# Patient Record
Sex: Female | Born: 1935 | Race: White | Hispanic: No | Marital: Single | Smoking: Never smoker
Health system: Southern US, Community
[De-identification: ages and names within clinical notes are randomized; demographics above are authoritative.]

## PROBLEM LIST (undated history)

## (undated) DIAGNOSIS — M1990 Unspecified osteoarthritis, unspecified site: Secondary | ICD-10-CM

## (undated) DIAGNOSIS — I509 Heart failure, unspecified: Secondary | ICD-10-CM

## (undated) DIAGNOSIS — M810 Age-related osteoporosis without current pathological fracture: Secondary | ICD-10-CM

## (undated) DIAGNOSIS — H409 Unspecified glaucoma: Secondary | ICD-10-CM

## (undated) DIAGNOSIS — E785 Hyperlipidemia, unspecified: Secondary | ICD-10-CM

## (undated) DIAGNOSIS — J449 Chronic obstructive pulmonary disease, unspecified: Secondary | ICD-10-CM

## (undated) DIAGNOSIS — K219 Gastro-esophageal reflux disease without esophagitis: Secondary | ICD-10-CM

## (undated) DIAGNOSIS — I10 Essential (primary) hypertension: Secondary | ICD-10-CM

## (undated) HISTORY — DX: Chronic obstructive pulmonary disease, unspecified: J44.9

---

## 2014-06-16 ENCOUNTER — Other Ambulatory Visit (HOSPITAL_COMMUNITY): Payer: Self-pay | Admitting: Internal Medicine

## 2014-06-16 DIAGNOSIS — R1310 Dysphagia, unspecified: Secondary | ICD-10-CM

## 2014-06-20 ENCOUNTER — Ambulatory Visit (HOSPITAL_COMMUNITY)
Admission: RE | Admit: 2014-06-20 | Discharge: 2014-06-20 | Disposition: A | Discharge status: Home / Self Care | Payer: Medicare Other | Source: Ambulatory Visit | Attending: Internal Medicine | Admitting: Internal Medicine

## 2014-06-20 DIAGNOSIS — R1310 Dysphagia, unspecified: Secondary | ICD-10-CM

## 2014-06-20 DIAGNOSIS — R1313 Dysphagia, pharyngeal phase: Secondary | ICD-10-CM | POA: Insufficient documentation

## 2014-06-20 NOTE — Procedures (Signed)
Objective Swallowing Evaluation: Modified Barium Swallowing Study  Patient Details  Name: Penny Alvarez MRN: [id number] Date of Birth: 1918/10/05  Today's Date: 06/20/2014 Time: 4098-1191 SLP Time Calculation (min): 21 min  Past Medical History: No past medical history on file. Past Surgical History: No past surgical history on file. HPI:  80 yo female with PMH significant for acid reflux, arthritis, HTN, CHF, glaucoma, allergic rhinitis, Mortons neuroma, right bundle branch blocl, hyperlipidemia, and osteoporosis. She is referred from SLP at her ALF to rule out silent aspiration.     Assessment / Plan / Recommendation Clinical Impression  Dysphagia Diagnosis: Mild pharyngeal phase dysphagia Clinical impression: Pt presents with a mild pharyngeal dysphagia characterized by mild pharyngeal residue at the vallecular and posterior pharyngeal wall. Mixed consistency bolus containing a pill and thin liquids via straw resulted in trace-mild amounts of premature spillage of thin liquid to the pyriform sinuses, with barium tablet remaining in the valleculae until easily cleared with a pureed bolus. Pt demonstrated adequate airway protection across all consistencies with no penetration or aspiration observed. Recommend to continue regular textures and thin liquids with medications administered whole in puree.    Treatment Recommendation       Diet Recommendation Regular;Thin liquid   Liquid Administration via: Cup;Straw Medication Administration: Whole meds with puree Supervision: Patient able to self feed Compensations: Slow rate;Small sips/bites Postural Changes and/or Swallow Maneuvers: Seated upright 90 degrees;Upright 30-60 min after meal    Other  Recommendations Oral Care Recommendations: Oral care BID   Follow Up Recommendations  None    Frequency and Duration        Pertinent Vitals/Pain n/a    SLP Swallow Goals     General Date of Onset:  (pt reports this has been ongoing for  "years") HPI: 80 yo female with PMH significant for acid reflux, arthritis, HTN, CHF, glaucoma, allergic rhinitis, Mortons neuroma, right bundle branch blocl, hyperlipidemia, and osteoporosis. She is referred from SLP at her ALF to rule out silent aspiration. Type of Study: Modified Barium Swallowing Study Reason for Referral: Objectively evaluate swallowing function Previous Swallow Assessment: none in chart Diet Prior to this Study: Regular;Thin liquids Respiratory Status: Room air History of Recent Intubation: No Behavior/Cognition: Alert;Cooperative;Pleasant mood Oral Cavity - Dentition: Adequate natural dentition Oral Motor / Sensory Function: Within functional limits Self-Feeding Abilities: Able to feed self Patient Positioning: Upright in chair Baseline Vocal Quality: Clear Volitional Swallow: Able to elicit Anatomy: Within functional limits Pharyngeal Secretions: Not observed secondary MBS    Reason for Referral Objectively evaluate swallowing function   Oral Phase Oral Preparation/Oral Phase Oral Phase: WFL   Pharyngeal Phase Pharyngeal Phase Pharyngeal Phase: Impaired Pharyngeal - Thin Pharyngeal - Thin Cup: Reduced pharyngeal peristalsis;Reduced tongue base retraction;Pharyngeal residue - valleculae;Pharyngeal residue - posterior pharnyx Pharyngeal - Thin Straw: Reduced pharyngeal peristalsis;Reduced tongue base retraction;Pharyngeal residue - valleculae;Pharyngeal residue - posterior pharnyx;Premature spillage to pyriform sinuses (trace-mild premature spillage only when taking pill) Pharyngeal - Solids Pharyngeal - Puree: Reduced pharyngeal peristalsis;Reduced tongue base retraction Pharyngeal - Regular: Reduced pharyngeal peristalsis;Reduced tongue base retraction;Pharyngeal residue - valleculae;Pharyngeal residue - posterior pharnyx  Cervical Esophageal Phase    GO    Cervical Esophageal Phase Cervical Esophageal Phase: [facility]    Functional Assessment Tool Used:  skilled clinical judgment Functional Limitations: Swallowing Swallow Current Status ([id number]): At least 1 percent but less than 20 percent impaired, limited or restricted Swallow Goal Status [phone]): At least 1 percent but less than 20 percent impaired, limited or restricted Swallow Discharge  Status [phone]([id number]): At least 1 percent but less than 20 percent impaired, limited or restricted      Penny Alvarez, M.A. CCC-SLP [phone]  Penny Alvarez, Penny Alvarez 06/20/2014, 12:41 PM

## 2014-09-15 ENCOUNTER — Encounter (HOSPITAL_BASED_OUTPATIENT_CLINIC_OR_DEPARTMENT_OTHER): Payer: Self-pay | Admitting: Emergency Medicine

## 2014-09-15 ENCOUNTER — Emergency Department (HOSPITAL_BASED_OUTPATIENT_CLINIC_OR_DEPARTMENT_OTHER): Payer: Medicare Other

## 2014-09-15 ENCOUNTER — Emergency Department (HOSPITAL_BASED_OUTPATIENT_CLINIC_OR_DEPARTMENT_OTHER)
Admission: EM | Admit: 2014-09-15 | Discharge: 2014-09-15 | Disposition: A | Discharge status: Home / Self Care | Payer: Medicare Other | Attending: Emergency Medicine | Admitting: Emergency Medicine

## 2014-09-15 DIAGNOSIS — Y9389 Activity, other specified: Secondary | ICD-10-CM | POA: Insufficient documentation

## 2014-09-15 DIAGNOSIS — M549 Dorsalgia, unspecified: Secondary | ICD-10-CM

## 2014-09-15 DIAGNOSIS — W1839XA Other fall on same level, initial encounter: Secondary | ICD-10-CM | POA: Diagnosis not present

## 2014-09-15 DIAGNOSIS — S239XXD Sprain of unspecified parts of thorax, subsequent encounter: Secondary | ICD-10-CM | POA: Insufficient documentation

## 2014-09-15 DIAGNOSIS — S43401A Unspecified sprain of right shoulder joint, initial encounter: Secondary | ICD-10-CM

## 2014-09-15 DIAGNOSIS — S32010D Wedge compression fracture of first lumbar vertebra, subsequent encounter for fracture with routine healing: Secondary | ICD-10-CM | POA: Diagnosis not present

## 2014-09-15 DIAGNOSIS — I509 Heart failure, unspecified: Secondary | ICD-10-CM | POA: Diagnosis not present

## 2014-09-15 DIAGNOSIS — S39012A Strain of muscle, fascia and tendon of lower back, initial encounter: Secondary | ICD-10-CM | POA: Diagnosis not present

## 2014-09-15 DIAGNOSIS — K219 Gastro-esophageal reflux disease without esophagitis: Secondary | ICD-10-CM | POA: Insufficient documentation

## 2014-09-15 DIAGNOSIS — Z8739 Personal history of other diseases of the musculoskeletal system and connective tissue: Secondary | ICD-10-CM | POA: Diagnosis not present

## 2014-09-15 DIAGNOSIS — Y929 Unspecified place or not applicable: Secondary | ICD-10-CM | POA: Insufficient documentation

## 2014-09-15 DIAGNOSIS — Z8719 Personal history of other diseases of the digestive system: Secondary | ICD-10-CM | POA: Diagnosis not present

## 2014-09-15 DIAGNOSIS — S29019D Strain of muscle and tendon of unspecified wall of thorax, subsequent encounter: Secondary | ICD-10-CM | POA: Insufficient documentation

## 2014-09-15 DIAGNOSIS — I10 Essential (primary) hypertension: Secondary | ICD-10-CM | POA: Diagnosis not present

## 2014-09-15 DIAGNOSIS — M4856XD Collapsed vertebra, not elsewhere classified, lumbar region, subsequent encounter for fracture with routine healing: Secondary | ICD-10-CM

## 2014-09-15 DIAGNOSIS — W19XXXA Unspecified fall, initial encounter: Secondary | ICD-10-CM

## 2014-09-15 DIAGNOSIS — S29012D Strain of muscle and tendon of back wall of thorax, subsequent encounter: Secondary | ICD-10-CM

## 2014-09-15 DIAGNOSIS — Z8639 Personal history of other endocrine, nutritional and metabolic disease: Secondary | ICD-10-CM | POA: Insufficient documentation

## 2014-09-15 DIAGNOSIS — S233XXD Sprain of ligaments of thoracic spine, subsequent encounter: Secondary | ICD-10-CM

## 2014-09-15 DIAGNOSIS — S22080D Wedge compression fracture of T11-T12 vertebra, subsequent encounter for fracture with routine healing: Secondary | ICD-10-CM | POA: Diagnosis not present

## 2014-09-15 DIAGNOSIS — H409 Unspecified glaucoma: Secondary | ICD-10-CM | POA: Insufficient documentation

## 2014-09-15 DIAGNOSIS — Z79899 Other long term (current) drug therapy: Secondary | ICD-10-CM | POA: Diagnosis not present

## 2014-09-15 DIAGNOSIS — M4854XD Collapsed vertebra, not elsewhere classified, thoracic region, subsequent encounter for fracture with routine healing: Secondary | ICD-10-CM

## 2014-09-15 DIAGNOSIS — S3992XA Unspecified injury of lower back, initial encounter: Secondary | ICD-10-CM | POA: Diagnosis present

## 2014-09-15 HISTORY — DX: Hyperlipidemia, unspecified: E78.5

## 2014-09-15 HISTORY — DX: Age-related osteoporosis without current pathological fracture: M81.0

## 2014-09-15 HISTORY — DX: Essential (primary) hypertension: I10

## 2014-09-15 HISTORY — DX: Gastro-esophageal reflux disease without esophagitis: K21.9

## 2014-09-15 HISTORY — DX: Unspecified osteoarthritis, unspecified site: M19.90

## 2014-09-15 HISTORY — DX: Heart failure, unspecified: I50.9

## 2014-09-15 HISTORY — DX: Unspecified glaucoma: H40.9

## 2014-09-15 MED ORDER — ACETAMINOPHEN 325 MG PO TABS
650.0000 mg | ORAL_TABLET | Freq: Once | ORAL | Status: AC
  Administered 2014-09-15: 650 mg via ORAL
  Filled 2014-09-15: qty 2

## 2014-09-15 NOTE — ED Notes (Signed)
Pt with [facility] from [facility] with right arm pain since last week. Pt fell last weekend but was not evaluated. Pt has not received her BP medication this morning.

## 2014-09-15 NOTE — ED Notes (Signed)
MD at bedside. 

## 2014-09-15 NOTE — ED Provider Notes (Signed)
CSN: [id number]     Arrival date & time 09/15/14  [id number] History   First MD Initiated Contact with Patient 09/15/14 1052     Chief Complaint  Patient presents with  . Arm Pain     Patient is a 80 y.o. female presenting with arm pain. The history is provided by the patient.  Arm Pain This is a new problem. The problem occurs constantly. The problem has not changed since onset.Pertinent negatives include no chest pain, no abdominal pain and no headaches. Exacerbated by: movement. The symptoms are relieved by rest.  Pt fell last week while at nursing facility (she uses walker, ran into a trash can and fell down) injuring her right arm as well as her low back.  No head injury.  No LOC No HA No CP/abd pain No weakness She does report h/o back pain prior to fall and now worse since fall No new weakness She has not been evaluated for her right arm pain  She uses walker at baseline  Past Medical History  Diagnosis Date  . Hypertension   . Congestive heart failure   . Glaucoma   . Arthritis   . Osteoporosis   . GERD (gastroesophageal reflux disease)   . Hyperlipidemia    No past surgical history on file. No family history on file. History  Substance Use Topics  . Smoking status: Never Smoker   . Smokeless tobacco: Not on file  . Alcohol Use: No   OB History   Grav Para Term Preterm Abortions TAB SAB Ect Mult Living                 Review of Systems  Constitutional: Negative for fatigue.  Cardiovascular: Negative for chest pain.  Gastrointestinal: Negative for abdominal pain.  Neurological: Negative for headaches.  All other systems reviewed and are negative.     Allergies  Actonel; Aspirin; Depakote; Diltiazem; Fosamax; and Hydralazine  Home Medications   Prior to Admission medications   Medication Sig Start Date End Date Taking? Authorizing Provider  nebivolol (BYSTOLIC) 2.5 MG tablet Take 2.5 mg by mouth daily.   Yes Historical Provider, MD  pantoprazole  (PROTONIX) 40 MG tablet Take 40 mg by mouth daily.   Yes Historical Provider, MD  torsemide (DEMADEX) 20 MG tablet Take 20 mg by mouth daily.   Yes Historical Provider, MD   BP 219/81  Pulse 75  Temp(Src) 99.1 F (37.3 C) (Oral)  Resp 16  SpO2 100% Physical Exam CONSTITUTIONAL: elderly, frail but no distress HEAD: Normocephalic/atraumatic EYES: EOMI/PERRL ENMT: Mucous membranes moist NECK: supple no meningeal signs SPINE:no cervical spine tenderness Lower thoracic/upper lumbar tenderness No bruising/crepitance/stepoffs noted to spine CV: S1/S2 noted, no murmurs/rubs/gallops noted LUNGS: Lungs are clear to auscultation bilaterally, no apparent distress ABDOMEN: soft, nontender, no rebound or guarding GU:no cva tenderness NEURO: Pt is awake/alert, moves all extremitiesx4 EXTREMITIES: pulses normal, full ROM Mild tenderness to right anterior shoulder.  No deformity is noted.  She can abduct right shoulder but is limited due to pain.   All other extremities/joints palpated/ranged and nontender No tenderness with ROM of either hip SKIN: warm, color normal PSYCH: no abnormalities of mood noted  ED Course  Procedures   Pt can ambulate here in the ED with walker No ACUTE finding on imaging She is well appearing, no distress I feel she is safe/appropriate for d/c home (no acute spinal injury) Discussed with patient and her relative/power of attorney Imaging Review Dg Thoracic Spine 2 View  09/15/2014   CLINICAL DATA:  Mid and low back pain post fall last week and, personal history osteoporosis, initial encounter  EXAM: THORACIC SPINE - 2 VIEW  COMPARISON:  None ; correlation chest radiographs 08/14/2012  FINDINGS: Diffuse osseous demineralization.  Marked superior endplate compression deformity of the T12 vertebral body with retropulsion of fragments.  Increased height loss of T12 versus prior chest radiograph.  Remaining thoracic vertebrae normal in height and alignment.  No  additional fracture or subluxation.  Levoconvex thoracolumbar scoliosis.  Extensive atherosclerotic calcification aorta.  Mitral annular calcification noted.  IMPRESSION: Marked osseous demineralization.  Increased superior endplate compression fracture of T12 vertebral body since 2013.  Persistent mild retropulsion of T12 fragments.   Electronically Signed   By: Ulyses SouthwardMark  Boles M.D.   On: 09/15/2014 12:28   Dg Lumbar Spine Complete  09/15/2014   CLINICAL DATA:  Fall, low back pain  EXAM: LUMBAR SPINE - COMPLETE 4+ VIEW  COMPARISON:  07/11/2011  FINDINGS: Five lumbar type vertebral bodies.  Normal lumbar lordosis.  Moderate to severe anterior compression fracture deformity at L1. No retropulsion. This has progressed from 2012, when it was mild.  Lumbar vertebral body heights are essentially maintained, with mild superior endplate changes at L2 and L3.  Grade 1 anterolisthesis of L4 on L5.  Mild multilevel degenerative changes.  Visualized bony pelvis appears intact.  Vascular calcifications.  Right total hip arthroplasty.  IMPRESSION: Moderate to severe anterior compression fracture deformity at L1, progressed from 2012. No retropulsion.   Electronically Signed   By: Charline BillsSriyesh  Krishnan M.D.   On: 09/15/2014 12:17   Dg Shoulder Right  09/15/2014   CLINICAL DATA:  Fall last weekend with right arm pain since. Prior shoulder surgery.  EXAM: RIGHT SHOULDER - 2+ VIEW  COMPARISON:  07/31/2009  FINDINGS: Plate and screw fixation of a proximal humerus fracture is again identified. Fracture alignment on AP and scapular Y-views is grossly unchanged compared to the prior study allowing for some differences in projection and rotation. No dislocation is seen. No definite acute fracture is identified. No definite evidence of screw loosening or fracture. There is progressive, severe glenohumeral joint space narrowing with subchondral sclerosis.  IMPRESSION: Old proximal right humerus fracture status post internal fixation  without definite acute osseous abnormality identified. Progressive, severe glenohumeral osteoarthrosis.   Electronically Signed   By: Sebastian AcheAllen  Grady   On: 09/15/2014 11:09      MDM   Final diagnoses:  Fall  Back pain  Sprain of right shoulder, initial encounter  Lumbar strain, initial encounter  Thoracic sprain and strain, subsequent encounter  Compression fracture of thoracic spine, non-traumatic, with routine healing, subsequent encounter  Compression fracture of lumbar spine, non-traumatic, with routine healing, subsequent encounter    Nursing notes including past medical history and social history reviewed and considered in documentation xrays reviewed and considered      Joya Gaskinsonald W Emigdio Wildeman, MD 09/15/14 1259

## 2014-09-15 NOTE — ED Notes (Signed)
Patient transported to CT 

## 2014-10-14 ENCOUNTER — Emergency Department (HOSPITAL_BASED_OUTPATIENT_CLINIC_OR_DEPARTMENT_OTHER): Payer: Medicare Other

## 2014-10-14 ENCOUNTER — Encounter (HOSPITAL_BASED_OUTPATIENT_CLINIC_OR_DEPARTMENT_OTHER): Payer: Self-pay

## 2014-10-14 ENCOUNTER — Emergency Department (HOSPITAL_BASED_OUTPATIENT_CLINIC_OR_DEPARTMENT_OTHER)
Admission: EM | Admit: 2014-10-14 | Discharge: 2014-10-14 | Disposition: A | Discharge status: Home / Self Care | Payer: Medicare Other | Attending: Emergency Medicine | Admitting: Emergency Medicine

## 2014-10-14 DIAGNOSIS — I10 Essential (primary) hypertension: Secondary | ICD-10-CM | POA: Insufficient documentation

## 2014-10-14 DIAGNOSIS — M810 Age-related osteoporosis without current pathological fracture: Secondary | ICD-10-CM | POA: Diagnosis not present

## 2014-10-14 DIAGNOSIS — K219 Gastro-esophageal reflux disease without esophagitis: Secondary | ICD-10-CM | POA: Diagnosis not present

## 2014-10-14 DIAGNOSIS — J0130 Acute sphenoidal sinusitis, unspecified: Secondary | ICD-10-CM | POA: Insufficient documentation

## 2014-10-14 DIAGNOSIS — S022XXA Fracture of nasal bones, initial encounter for closed fracture: Secondary | ICD-10-CM | POA: Diagnosis not present

## 2014-10-14 DIAGNOSIS — Y92128 Other place in nursing home as the place of occurrence of the external cause: Secondary | ICD-10-CM | POA: Insufficient documentation

## 2014-10-14 DIAGNOSIS — Y998 Other external cause status: Secondary | ICD-10-CM | POA: Insufficient documentation

## 2014-10-14 DIAGNOSIS — S8002XA Contusion of left knee, initial encounter: Secondary | ICD-10-CM | POA: Insufficient documentation

## 2014-10-14 DIAGNOSIS — I509 Heart failure, unspecified: Secondary | ICD-10-CM | POA: Insufficient documentation

## 2014-10-14 DIAGNOSIS — S0992XA Unspecified injury of nose, initial encounter: Secondary | ICD-10-CM | POA: Diagnosis present

## 2014-10-14 DIAGNOSIS — Z79899 Other long term (current) drug therapy: Secondary | ICD-10-CM | POA: Diagnosis not present

## 2014-10-14 DIAGNOSIS — Z8639 Personal history of other endocrine, nutritional and metabolic disease: Secondary | ICD-10-CM | POA: Diagnosis not present

## 2014-10-14 DIAGNOSIS — W010XXA Fall on same level from slipping, tripping and stumbling without subsequent striking against object, initial encounter: Secondary | ICD-10-CM | POA: Insufficient documentation

## 2014-10-14 DIAGNOSIS — Y9389 Activity, other specified: Secondary | ICD-10-CM | POA: Diagnosis not present

## 2014-10-14 DIAGNOSIS — Z792 Long term (current) use of antibiotics: Secondary | ICD-10-CM | POA: Insufficient documentation

## 2014-10-14 DIAGNOSIS — S40212A Abrasion of left shoulder, initial encounter: Secondary | ICD-10-CM | POA: Insufficient documentation

## 2014-10-14 DIAGNOSIS — S199XXA Unspecified injury of neck, initial encounter: Secondary | ICD-10-CM | POA: Diagnosis not present

## 2014-10-14 DIAGNOSIS — S79912A Unspecified injury of left hip, initial encounter: Secondary | ICD-10-CM | POA: Insufficient documentation

## 2014-10-14 DIAGNOSIS — Z87440 Personal history of urinary (tract) infections: Secondary | ICD-10-CM | POA: Insufficient documentation

## 2014-10-14 DIAGNOSIS — W19XXXA Unspecified fall, initial encounter: Secondary | ICD-10-CM

## 2014-10-14 DIAGNOSIS — H409 Unspecified glaucoma: Secondary | ICD-10-CM | POA: Diagnosis not present

## 2014-10-14 DIAGNOSIS — T07XXXA Unspecified multiple injuries, initial encounter: Secondary | ICD-10-CM

## 2014-10-14 MED ORDER — AMOXICILLIN 500 MG PO CAPS: 500.0000 mg | ORAL_CAPSULE | Freq: Three times a day (TID) | ORAL | Status: DC

## 2014-10-14 NOTE — ED Notes (Addendum)
[facility] after fall while using walker this am, no loc per EMS. Complains of left hip and nasal pain. Patient also fall last pm at assisted living and [facility]. Diagnosed with UTI and started on Cipro. Patient is alert and oriented and unsure if she became weak or tripped over rug. Patient has become weak due to decreased intake due to inability to swallow solid food. Alert and oriented

## 2014-10-14 NOTE — Discharge Instructions (Signed)
Abrasion An abrasion is a cut or scrape of the skin. Abrasions do not extend through all layers of the skin and most heal within 10 days. It is important to care for your abrasion properly to prevent infection. CAUSES  Most abrasions are caused by falling on, or gliding across, the ground or other surface. When your skin rubs on something, the outer and inner layer of skin rubs off, causing an abrasion. DIAGNOSIS  Your caregiver will be able to diagnose an abrasion during a physical exam.  TREATMENT  Your treatment depends on how large and deep the abrasion is. Generally, your abrasion will be cleaned with water and a mild soap to remove any dirt or debris. An antibiotic ointment may be put over the abrasion to prevent an infection. A bandage (dressing) may be wrapped around the abrasion to keep it from getting dirty.  You may need a tetanus shot if:  You cannot remember when you had your last tetanus shot.  You have never had a tetanus shot.  The injury broke your skin. If you get a tetanus shot, your arm may swell, get red, and feel warm to the touch. This is common and not a problem. If you need a tetanus shot and you choose not to have one, there is a rare chance of getting tetanus. Sickness from tetanus can be serious.  HOME CARE INSTRUCTIONS   If a dressing was applied, change it at least once a day or as directed by your caregiver. If the bandage sticks, soak it off with warm water.   Wash the area with water and a mild soap to remove all the ointment 2 times a day. Rinse off the soap and pat the area dry with a clean towel.   Reapply any ointment as directed by your caregiver. This will help prevent infection and keep the bandage from sticking. Use gauze over the wound and under the dressing to help keep the bandage from sticking.   Change your dressing right away if it becomes wet or dirty.   Only take over-the-counter or prescription medicines for pain, discomfort, or fever as  directed by your caregiver.   Follow up with your caregiver within 24-48 hours for a wound check, or as directed. If you were not given a wound-check appointment, look closely at your abrasion for redness, swelling, or pus. These are signs of infection. SEEK IMMEDIATE MEDICAL CARE IF:   You have increasing pain in the wound.   You have redness, swelling, or tenderness around the wound.   You have pus coming from the wound.   You have a fever or persistent symptoms for more than 2-3 days.  You have a fever and your symptoms suddenly get worse.  You have a bad smell coming from the wound or dressing.  MAKE SURE YOU:   Understand these instructions.  Will watch your condition.  Will get help right away if you are not doing well or get worse. Document Released: 08/13/2005 Document Revised: 10/20/2012 Document Reviewed: 10/07/2011 [facility] Patient Information 2015 [place], [place]. This information is not intended to replace advice given to you by your health care provider. Make sure you discuss any questions you have with your health care provider.  Nasal Fracture A nasal fracture is a break or crack in the bones of the nose. A minor break usually heals in a month. You often will receive black eyes from a nasal fracture. This is not a cause for concern. The black eyes will  go away over 1 to 2 weeks.  DIAGNOSIS  Your caregiver may want to examine you if you are concerned about a fracture of the nose. X-rays of the nose may not show a nasal fracture even when one is present. Sometimes your caregiver must wait 1 to 5 days after the injury to re-check the nose for alignment and to take additional X-rays. Sometimes the caregiver must wait until the swelling has gone down. TREATMENT Minor fractures that have caused no deformity often do not require treatment. More serious fractures where bones are displaced may require surgery. This will take place after the swelling is gone. Surgery will  stabilize and align the fracture. HOME CARE INSTRUCTIONS   Put ice on the injured area.  Put ice in a plastic bag.  Place a towel between your skin and the bag.  Leave the ice on for 15-20 minutes, 03-04 times a day.  Take medications as directed by your caregiver.  Only take over-the-counter or prescription medicines for pain, discomfort, or fever as directed by your caregiver.  If your nose starts bleeding, squeeze the soft parts of the nose against the center wall while you are sitting in an upright position for 10 minutes.  Contact sports should be avoided for at least 3 to 4 weeks or as directed by your caregiver. SEEK MEDICAL CARE IF:  Your pain increases or becomes severe.  You continue to have nosebleeds.  The shape of your nose does not return to normal within 5 days.  You have pus draining from the nose. SEEK IMMEDIATE MEDICAL CARE IF:   You have bleeding from your nose that does not stop after 20 minutes of pinching the nostrils closed and keeping ice on the nose.  You have clear fluid draining from your nose.  You notice a grape-like swelling on the dividing wall between the nostrils (septum). This is a collection of blood (hematoma) that must be drained to help prevent infection.  You have difficulty moving your eyes.  You have recurrent vomiting. Document Released: 10/31/2000 Document Revised: 01/26/2012 Document Reviewed: 02/17/2011 [facility] Patient Information 2015 [place], [place]. This information is not intended to replace advice given to you by your health care provider. Make sure you discuss any questions you have with your health care provider.  Sinusitis Sinusitis is redness, soreness, and puffiness (inflammation) of the air pockets in the bones of your face (sinuses). The redness, soreness, and puffiness can cause air and mucus to get trapped in your sinuses. This can allow germs to grow and cause an infection.  HOME CARE   Drink enough fluids to  keep your pee (urine) clear or pale yellow.  Use a humidifier in your home.  Run a hot shower to create steam in the bathroom. Sit in the bathroom with the door closed. Breathe in the steam 3-4 times a day.  Put a warm, moist washcloth on your face 3-4 times a day, or as told by your doctor.  Use salt water sprays (saline sprays) to wet the thick fluid in your nose. This can help the sinuses drain.  Only take medicine as told by your doctor. GET HELP RIGHT AWAY IF:   Your pain gets worse.  You have very bad headaches.  You are sick to your stomach (nauseous).  You throw up (vomit).  You are very sleepy (drowsy) all the time.  Your face is puffy (swollen).  Your vision changes.  You have a stiff neck.  You have trouble breathing. MAKE  SURE YOU:   Understand these instructions.  Will watch your condition.  Will get help right away if you are not doing well or get worse. Document Released: 04/21/2008 Document Revised: 07/28/2012 Document Reviewed: 06/08/2012 [facility] Patient Information 2015 [place], [place]. This information is not intended to replace advice given to you by your health care provider. Make sure you discuss any questions you have with your health care provider.

## 2014-10-14 NOTE — ED Provider Notes (Signed)
CSN: [id number]     Arrival date & time 10/14/14  1132 History   First MD Initiated Contact with Patient 10/14/14 1228     Chief Complaint  Patient presents with  . Fall     (Consider location/radiation/quality/duration/timing/severity/associated sxs/prior Treatment) HPI Comments: Patient presents after a fall. [place] [facility] [place] She was using a walker and states that it rolled and area of the carpet that was lifted up and the walker shifted and she rolled over her walker. She fell face forward into the carpeted floor. She has pain to her nose as well as both her shoulders and her left side. Her cousin who is with her said that she was recently in the hospital at [facility] for chest pain. She was there for about a week and was discharged yesterday. After she was discharged she had a fall at the nursing home and was seen in the ER at [facility] yesterday she was diagnosed with a urinary tract infection. She was started on antibiotics. She was sent home from the ER last night.   Patient is a 80 y.o. female presenting with fall.  Fall Pertinent negatives include no chest pain, no abdominal pain, no headaches and no shortness of breath.    Past Medical History  Diagnosis Date  . Hypertension   . Congestive heart failure   . Glaucoma   . Arthritis   . Osteoporosis   . GERD (gastroesophageal reflux disease)   . Hyperlipidemia    History reviewed. No pertinent past surgical history. No family history on file. History  Substance Use Topics  . Smoking status: Never Smoker   . Smokeless tobacco: Not on file  . Alcohol Use: No   OB History    No data available     Review of Systems  Constitutional: Negative for fever, chills, diaphoresis and fatigue.  HENT: Negative for congestion, rhinorrhea and sneezing.   Eyes: Negative.   Respiratory: Negative for cough, chest tightness and shortness of breath.   Cardiovascular: Negative for chest pain and  leg swelling.  Gastrointestinal: Negative for nausea, vomiting, abdominal pain, diarrhea and blood in stool.  Genitourinary: Negative for frequency, hematuria, flank pain and difficulty urinating.  Musculoskeletal: Positive for myalgias, arthralgias and neck pain. Negative for back pain.  Skin: Positive for wound. Negative for rash.  Neurological: Negative for dizziness, speech difficulty, weakness, numbness and headaches.      Allergies  Actonel; Aspirin; Depakote; Diltiazem; Fosamax; and Hydralazine  Home Medications   Prior to Admission medications   Medication Sig Start Date End Date Taking? Authorizing Provider  calcium carbonate (OS-CAL) 600 MG TABS tablet Take 600 mg by mouth 2 (two) times daily with a meal.   Yes Historical Provider, MD  ciprofloxacin (CIPRO) 250 MG tablet Take 250 mg by mouth 2 (two) times daily.   Yes Historical Provider, MD  latanoprost (XALATAN) 0.005 % ophthalmic solution Place 1 drop into the left eye at bedtime.   Yes Historical Provider, MD  nebivolol (BYSTOLIC) 2.5 MG tablet Take 2.5 mg by mouth daily.    Historical Provider, MD  pantoprazole (PROTONIX) 40 MG tablet Take 40 mg by mouth daily.    Historical Provider, MD  torsemide (DEMADEX) 20 MG tablet Take 20 mg by mouth daily.    Historical Provider, MD   BP 160/74 mmHg  Pulse 68  Temp(Src) 97.7 F (36.5 C) (Oral)  Resp 18  Ht 4\' 10"  (1.473 m)  Wt 108 lb (  48.988 kg)  BMI 22.58 kg/m2  SpO2 99% Physical Exam  Constitutional: She is oriented to person, place, and time. She appears well-developed and well-nourished.  HENT:  Head: Normocephalic.  Small abrasion and swelling to the nasal bridge. Small amount of dried blood in the nares.  Eyes: Pupils are equal, round, and reactive to light.  Neck:  Mild tenderness in the cervical spine. There is no pain to the thoracic or lumbosacral spine.  Cardiovascular: Normal rate, regular rhythm and normal heart sounds.   Pulmonary/Chest: Effort normal and  breath sounds normal. No respiratory distress. She has no wheezes. She has no rales. She exhibits no tenderness.  There some mild tenderness to the lower right rib cage. No crepitus or deformity is noted. No signs of external trauma are noted to the chest or abdomen   Abdominal: Soft. Bowel sounds are normal. There is no tenderness. There is no rebound and no guarding.  Musculoskeletal: Normal range of motion. She exhibits no edema.  Patient has an abrasion to her left shoulder. She has tenderness on palpation of both shoulders but no swelling or deformity. She has swelling and tenderness to her left knee with some ecchymosis. There is some mild pain on range of motion the left hip. There is no pain to the ankle. There is no other pain on palpation or range of motion of the extremities. She's neurovascularly intact distally.   Lymphadenopathy:    She has no cervical adenopathy.  Neurological: She is alert and oriented to person, place, and time.  Skin: Skin is warm and dry. No rash noted.  Psychiatric: She has a normal mood and affect.    ED Course  Procedures (including critical care time) Labs Review Labs Reviewed - No data to display  Imaging Review Dg Nasal Bones  10/14/2014   CLINICAL DATA:  Larey SeatFell at nursing home and is having nasal pain and bruising, fell today, initial evaluation  EXAM: NASAL BONES - 3+ VIEW  COMPARISON:  None.  FINDINGS: Nondisplaced fractures at the distal tip of the nasal bones. Otherwise negative.  IMPRESSION: Subtle nondisplaced nasal bone fractures.   Electronically Signed   By: Esperanza Heiraymond  Rubner M.D.   On: 10/14/2014 12:36   Dg Chest 2 View  10/14/2014   CLINICAL DATA:  Fall  EXAM: CHEST  2 VIEW  COMPARISON:  None.  FINDINGS: Chronic interstitial markings. Mild left basilar opacity, likely atelectasis. No pleural effusion or pneumothorax.  Cardiomegaly.  Left shoulder arthroplasty. Status post ORIF of the right proximal humerus.  IMPRESSION: No evidence of acute  cardiopulmonary disease.   Electronically Signed   By: Charline BillsSriyesh  Krishnan M.D.   On: 10/14/2014 14:35   Dg Shoulder Right  10/14/2014   CLINICAL DATA:  Right shoulder pain.  Larey SeatFell today and fell yesterday.  EXAM: RIGHT SHOULDER - 2+ VIEW  COMPARISON:  10/07/2014.  Chest radiographs dated 10/14/2014.  FINDINGS: Previously demonstrated screw and plate fixation of the proximal humerus. Previously noted right glenohumeral joint degenerative changes and diffuse osteopenia. No acute fracture or dislocation seen. There is a rectangular-shaped calcific density overlying the right mid to lower lung zone on the frontal view, not seen on the axillary view or on chest radiographs obtained today.  IMPRESSION: No acute fracture dislocation. Hardware fixation of an old proximal right humerus fracture and right glenohumeral joint degenerative changes.   Electronically Signed   By: Gordan PaymentSteve  Reid M.D.   On: 10/14/2014 14:40   Dg Hip Complete Left  10/14/2014  CLINICAL DATA:  Initial evaluation for left hip pain laterally, fell at nursing home this morning  EXAM: LEFT HIP - COMPLETE 2+ VIEW  COMPARISON:  None.  FINDINGS: Total right hip replacement. Pelvic bones intact. Mild to moderate left hip joint arthritis. No evidence of proximal left femur fracture or dislocation.  IMPRESSION: No acute abnormalities   Electronically Signed   By: Esperanza Heir M.D.   On: 10/14/2014 12:35   Ct Head Wo Contrast  10/14/2014   CLINICAL DATA:  Neck pain and nasal abrasion following a fall today.  EXAM: CT HEAD WITHOUT CONTRAST  CT CERVICAL SPINE WITHOUT CONTRAST  TECHNIQUE: Multidetector CT imaging of the head and cervical spine was performed following the standard protocol without intravenous contrast. Multiplanar CT image reconstructions of the cervical spine were also generated.  COMPARISON:  07/10/2011.  FINDINGS: CT HEAD FINDINGS  Diffusely enlarged ventricles and subarachnoid spaces. Patchy white matter low density in both cerebral  hemispheres. Old bilateral basal ganglia and right thalamic lacunar infarcts. No skull fracture, intracranial hemorrhage, mass lesion or CT evidence of acute infarction. Interval right sphenoid sinus air-fluid level. Moderate left maxillary sinus mucosal thickening inferiorly, not previously included.  CT CERVICAL SPINE FINDINGS  Multilevel degenerative changes. Stable fusion of the C5 and C6 vertebral bodies. Facet degenerative changes with associated minimal anterolisthesis at the C4-5 level without significant change. Mild anterolisthesis at the C7-T1 and T1-2 levels is also stable. No fractures or acute subluxations. No prevertebral soft tissue swelling. Bilateral carotid artery calcifications. The lung apices are clear.  IMPRESSION: 1. No skull fracture or intracranial hemorrhage. 2. No cervical spine fracture or acute subluxation. 3. Stable diffuse cerebral and cerebellar atrophy, chronic small vessel white matter ischemic changes and old tender infarcts. 4. Stable cervical spine degenerative changes. 5. Bilateral carotid artery atheromatous calcifications. 6. Acute right sphenoid sinusitis and chronic left maxillary sinusitis.   Electronically Signed   By: Gordan Payment M.D.   On: 10/14/2014 14:58   Ct Cervical Spine Wo Contrast  10/14/2014   CLINICAL DATA:  Neck pain and nasal abrasion following a fall today.  EXAM: CT HEAD WITHOUT CONTRAST  CT CERVICAL SPINE WITHOUT CONTRAST  TECHNIQUE: Multidetector CT imaging of the head and cervical spine was performed following the standard protocol without intravenous contrast. Multiplanar CT image reconstructions of the cervical spine were also generated.  COMPARISON:  07/10/2011.  FINDINGS: CT HEAD FINDINGS  Diffusely enlarged ventricles and subarachnoid spaces. Patchy white matter low density in both cerebral hemispheres. Old bilateral basal ganglia and right thalamic lacunar infarcts. No skull fracture, intracranial hemorrhage, mass lesion or CT evidence of acute  infarction. Interval right sphenoid sinus air-fluid level. Moderate left maxillary sinus mucosal thickening inferiorly, not previously included.  CT CERVICAL SPINE FINDINGS  Multilevel degenerative changes. Stable fusion of the C5 and C6 vertebral bodies. Facet degenerative changes with associated minimal anterolisthesis at the C4-5 level without significant change. Mild anterolisthesis at the C7-T1 and T1-2 levels is also stable. No fractures or acute subluxations. No prevertebral soft tissue swelling. Bilateral carotid artery calcifications. The lung apices are clear.  IMPRESSION: 1. No skull fracture or intracranial hemorrhage. 2. No cervical spine fracture or acute subluxation. 3. Stable diffuse cerebral and cerebellar atrophy, chronic small vessel white matter ischemic changes and old tender infarcts. 4. Stable cervical spine degenerative changes. 5. Bilateral carotid artery atheromatous calcifications. 6. Acute right sphenoid sinusitis and chronic left maxillary sinusitis.   Electronically Signed   By: Ardeth Perfect.D.  On: 10/14/2014 14:58   Dg Shoulder Left  10/14/2014   CLINICAL DATA:  Acute left shoulder pain after fall.  EXAM: LEFT SHOULDER - 2+ VIEW  COMPARISON:  None.  FINDINGS: Status post left shoulder arthroplasty. No fracture or dislocation is noted. Visualized ribs appear normal.  IMPRESSION: Status post left shoulder arthroplasty.  No acute abnormality seen.   Electronically Signed   By: Roque Lias M.D.   On: 10/14/2014 14:37   Dg Knee Complete 4 Views Left  10/14/2014   CLINICAL DATA:  Fall, left knee pain  EXAM: LEFT KNEE - COMPLETE 4+ VIEW  COMPARISON:  None.  FINDINGS: No fracture or dislocation is seen.  Mild degenerative changes.  No suprapatellar knee joint effusion.  IMPRESSION: No fracture or dislocation is seen.   Electronically Signed   By: Charline Bills M.D.   On: 10/14/2014 14:38     EKG Interpretation None      MDM   Final diagnoses:  Nasal fracture, closed,  initial encounter  Acute sphenoidal sinusitis, recurrence not specified  Abrasions of multiple sites    Patient presents after a mechanical fall. She has some nasal bone fractures but no other apparent injuries. She is alert and oriented. She is very unsteady with ambulation. Apparently she's been unsteady since her last recent hospitalization. Her family member who is with her is concerned that she needs a higher level of care facility. I contacted her current facility which is an assisted living facility. They are able to provide the patient with a wheelchair and can keep her in a wheelchair for now rather than having her ambulate in a walker. The patient's cousin who is with her will contact her primary care physician on Monday to work on establishing placement in a higher level of care facility.    Rolan Bucco, MD 10/14/14 [phone]

## 2015-01-02 ENCOUNTER — Encounter: Payer: Self-pay | Admitting: *Deleted

## 2015-01-04 ENCOUNTER — Non-Acute Institutional Stay (SKILLED_NURSING_FACILITY): Payer: Medicare Other | Admitting: Adult Health

## 2015-01-04 ENCOUNTER — Encounter: Payer: Self-pay | Admitting: Adult Health

## 2015-01-04 DIAGNOSIS — I5020 Unspecified systolic (congestive) heart failure: Secondary | ICD-10-CM

## 2015-01-04 DIAGNOSIS — J189 Pneumonia, unspecified organism: Secondary | ICD-10-CM | POA: Diagnosis not present

## 2015-01-04 DIAGNOSIS — R531 Weakness: Secondary | ICD-10-CM | POA: Diagnosis not present

## 2015-01-04 DIAGNOSIS — I4891 Unspecified atrial fibrillation: Secondary | ICD-10-CM | POA: Insufficient documentation

## 2015-01-04 DIAGNOSIS — I482 Chronic atrial fibrillation, unspecified: Secondary | ICD-10-CM

## 2015-01-04 DIAGNOSIS — R1310 Dysphagia, unspecified: Secondary | ICD-10-CM | POA: Diagnosis not present

## 2015-01-04 DIAGNOSIS — H409 Unspecified glaucoma: Secondary | ICD-10-CM

## 2015-01-04 DIAGNOSIS — I10 Essential (primary) hypertension: Secondary | ICD-10-CM | POA: Diagnosis not present

## 2015-01-04 DIAGNOSIS — K219 Gastro-esophageal reflux disease without esophagitis: Secondary | ICD-10-CM | POA: Diagnosis not present

## 2015-01-04 DIAGNOSIS — S82892A Other fracture of left lower leg, initial encounter for closed fracture: Secondary | ICD-10-CM | POA: Insufficient documentation

## 2015-01-04 NOTE — Progress Notes (Signed)
Patient ID: Penny Alvarez, female   DOB: 1918-07-23, 81 y.o.   MRN: [id number]   01/04/2015  Facility:  Nursing Home Location:  [facility] Room Number: 1003-1 LEVEL OF CARE:  SNF (31)   Chief Complaint  Patient presents with  . Hospitalization Follow-up    Generalized weakness, left ankle fracture S/P closed reduction and manipulation and casting of left ankle, pneumonia, atrial fibrillation with RVR, CHF, dysphagia, hypertension, GERD and glaucoma    HISTORY OF PRESENT ILLNESS:  This is a 81 year old female who was been admitted to [facility] on 01/01/15 from [facility]. She was at [facility] nursing home and apparently struck her left ankle on the side of the bed. She sustained a left ankle fracture. On 12/27/14 a closed reduction, manipulation and casting of left ankle was done. Chest x-ray on 12/28/14 shows opacity of the left lower lobe suggesting effusion and consolidation. She was started on IV Rocephin and Zithromax. She has generalized weakness and has been admitted for a short-term rehabilitation.  PAST MEDICAL HISTORY:  Past Medical History  Diagnosis Date  . Hypertension   . Glaucoma   . Osteoporosis   . GERD (gastroesophageal reflux disease)   . Hyperlipidemia   . COPD (chronic obstructive pulmonary disease)   . Arthritis     OA  . Congestive heart failure     Chronic AFib    CURRENT MEDICATIONS: Reviewed per MAR/see medication list  Allergies  Allergen Reactions  . Actonel [Risedronate Sodium]   . Aspirin   . Depakote [Divalproex Sodium]   . Diltiazem   . Fosamax [Alendronate Sodium]   . Hydralazine      REVIEW OF SYSTEMS:  Unable to obtain, patient is not a good historian  PHYSICAL EXAMINATION  GENERAL: no acute distress, normal body habitus EYES: conjunctivae normal, sclerae normal, normal eye lids NECK: supple, trachea midline, no neck masses, no thyroid tenderness, no thyromegaly LYMPHATICS: no LAN in  the neck, no supraclavicular LAN RESPIRATORY: breathing is even & unlabored, BS CTAB CARDIAC: Irregularly irregular heart rhythm, no murmur,no extra heart sounds,RLE trace edema GI: abdomen soft, normal BS, no masses, no tenderness, no hepatomegaly, no splenomegaly EXTREMITIES: LLE with cast, able to wiggle toes PSYCHIATRIC: the patient is alert & oriented to person, affect & behavior appropriate  LABS/RADIOLOGY: 12/31/14  sodium 145 potassium 3.6 glucose 91 BUN 14 creatinine 0.76 calcium 8.7 GFR 70 12/29/14  WBC 11.0 hemoglobin 10.6 hematocrit 34.0 MCV 101.5 Modified barium swallow shows severe aspiration 12/28/14  chest x-ray shows opacity of the left lower lobe suggesting an effusion and consolidation 12/27/14  Left ankle x-ray shows displaced fractures of the medial malleolus and distal fibula with mild inferior displacement of the medial malleolar fracture and mild lateral displacement of the distal fibular fracture. There is resultant lateral subluxation of the talus, mild underlying osteopenia of the calcaneus.   ASSESSMENT/PLAN:  Generalized weakness - for rehabilitation Left ankle fracture S/P closed reduction, manipulation and casting - nwb on left ankle; continue Lovenox 40 mg subcutaneous daily 21 days for DVT prophylaxis; Norco 5/325 mg 1 tab by mouth every 6 hours when necessary Ultram 50 mg 1 tab by mouth every 8 hours when necessary and Tylenol 1000 mg by mouth every a.m. and 650 mg by mouth daily at bedtime for pain; follow-up with orthopedic, Dr. Precious Gilding in 4 weeks HCAP - continue azithromycin 500 mg by mouth daily 5 days Atrial fibrillation with RVR - rate controlled; continue  Lopressor 100 mg by mouth 3 times a day CHF - stable; Bumex was tolerated in the hospital and may use if needed; she is allergic to Lasix and hydrochlorothiazide Dysphagia - no PEG tube per patient's wishes; for speech therapy Hypertension - BP has been elevated , 160s/90s, 170/100, 151/92; start  Bystolic 5 mg 1 tab by mouth daily and continue Lopressor 100 mg by mouth 3 times a day and clonidine 0.1 mg by mouth every 6 hours when necessary for SBP >160; BP/heart rate every shift 1 week GERD - continue Protonix 40 mg by mouth daily Glaucoma - continue Cosopt and latanoprost eyedrops  Goals of care:  Short-term rehabilitation    Labs/test ordered:  CBC, CMP  Spent 50 minutes in patient care.     [facility] Center[facility]INA-VARGAS,MONINA, NP [facility] [phone]

## 2015-01-05 ENCOUNTER — Non-Acute Institutional Stay (SKILLED_NURSING_FACILITY): Payer: Medicare Other | Admitting: Internal Medicine

## 2015-01-05 DIAGNOSIS — K5900 Constipation, unspecified: Secondary | ICD-10-CM | POA: Diagnosis not present

## 2015-01-05 DIAGNOSIS — I10 Essential (primary) hypertension: Secondary | ICD-10-CM

## 2015-01-05 DIAGNOSIS — K219 Gastro-esophageal reflux disease without esophagitis: Secondary | ICD-10-CM | POA: Diagnosis not present

## 2015-01-05 DIAGNOSIS — I4891 Unspecified atrial fibrillation: Secondary | ICD-10-CM | POA: Diagnosis not present

## 2015-01-05 DIAGNOSIS — R1310 Dysphagia, unspecified: Secondary | ICD-10-CM | POA: Diagnosis not present

## 2015-01-05 DIAGNOSIS — J690 Pneumonitis due to inhalation of food and vomit: Secondary | ICD-10-CM

## 2015-01-05 DIAGNOSIS — S82892A Other fracture of left lower leg, initial encounter for closed fracture: Secondary | ICD-10-CM | POA: Diagnosis not present

## 2015-01-05 NOTE — Progress Notes (Signed)
Patient ID: Penny MilanRuth Gaspari, female   DOB: [birth year], 81 y.o.   MRN: [id number]     [facility]   PCP: No primary care provider on file.  Code Status: dnr  Allergies  Allergen Reactions  . Actonel [Risedronate Sodium]   . Aspirin   . Depakote [Divalproex Sodium]   . Diltiazem   . Fosamax [Alendronate Sodium]   . Hydralazine     Chief Complaint  Patient presents with  . New Admit To SNF     HPI:  81 year old patient is here for short term rehabilitation post hospital admission from 12/27/14- 01/01/15 with a fall. She had left ankle fracture. She underwent closed reduction and manipulation with casting of the left ankle for left bimalleolar fracture. She was also treated for pneumonia, afib with RVR and was diagnosed to have dysphagia by modified barium swallow. She is seen in her room today. She is currently under total care and needs assistance with feeding. Her pain is under control with current pain medication. No new concern from staff On review of chart, her bp reading has been elevated. She is currently on dysphagia diet  Review of Systems:  Constitutional: Negative for fever, chills, diaphoresis.  HENT: Negative for headache, congestion, nasal discharge Eyes: Negative for eye pain, blurred vision, double vision and discharge.  Respiratory: Negative for cough, shortness of breath and wheezing.   Cardiovascular: Negative for chest pain, palpitations, leg swelling.  Gastrointestinal: Negative for heartburn, nausea, vomiting, abdominal pain Genitourinary: Negative for dysuria Musculoskeletal: Negative for back pain, falls Skin: Negative for itching, rash.  Neurological: Negative for dizziness Psychiatric/Behavioral: Negative for depression   Past Medical History  Diagnosis Date  . Hypertension   . Glaucoma   . Osteoporosis   . GERD (gastroesophageal reflux disease)   . Hyperlipidemia   . COPD (chronic obstructive pulmonary disease)     . Arthritis     OA  . Congestive heart failure     Chronic AFib   No past surgical history on file. Social History:   reports that she has never smoked. She does not have any smokeless tobacco history on file. She reports that she does not drink alcohol or use illicit drugs.  No family history on file.  Medications: Patient's Medications  New Prescriptions   No medications on file  Previous Medications   ALBUTEROL (PROVENTIL) (5 MG/ML) 0.5% NEBULIZER SOLUTION    Take 2.5 mg by nebulization every 4 (four) hours as needed for wheezing or shortness of breath.   BISACODYL (DULCOLAX) 10 MG SUPPOSITORY    Place 10 mg rectally as needed for moderate constipation.   CALCIUM CARBONATE (OS-CAL) 600 MG TABS TABLET    Take 600 mg by mouth 2 (two) times daily with a meal.   CLONIDINE (CATAPRES) 0.1 MG TABLET    Take 0.1 mg by mouth every 6 (six) hours as needed (for SBP >160).   DOCUSATE SODIUM (COLACE) 100 MG CAPSULE    Take 100 mg by mouth 2 (two) times daily.   DORZOLAMIDE-TIMOLOL (COSOPT) 22.3-6.8 MG/ML OPHTHALMIC SOLUTION    1 drop 2 (two) times daily.   FLUTICASONE (FLONASE) 50 MCG/ACT NASAL SPRAY    Place 1 spray into both nostrils at bedtime as needed for allergies or rhinitis.   HYDROCODONE-ACETAMINOPHEN (NORCO/VICODIN) 5-325 MG PER TABLET    Take 1 tablet by mouth every 6 (six) hours as needed for moderate pain.   ISOSORBIDE MONONITRATE (IMDUR) 30 MG 24 HR TABLET  Take 30 mg by mouth daily. Take 3 tabs= 90 mg   LATANOPROST (XALATAN) 0.005 % OPHTHALMIC SOLUTION    Place 1 drop into the left eye at bedtime.   METOPROLOL (LOPRESSOR) 100 MG TABLET    Take 100 mg by mouth 3 (three) times daily.   NEBIVOLOL (BYSTOLIC) 5 MG TABLET    Take 5 mg by mouth daily.   ONDANSETRON (ZOFRAN-ODT) 4 MG DISINTEGRATING TABLET    Take 4 mg by mouth every 8 (eight) hours as needed for nausea or vomiting.   PANTOPRAZOLE (PROTONIX) 40 MG TABLET    Take 40 mg by mouth daily.   TRAMADOL (ULTRAM) 50 MG TABLET     Take by mouth every 8 (eight) hours as needed.  Modified Medications   No medications on file  Discontinued Medications   No medications on file     Physical Exam: Filed Vitals:   01/05/15 0843  BP: 150/88  Pulse: 77  Temp: 98 F (36.7 C)  Resp: 18  SpO2: 92%    General- elderly female, well built, in no acute distress, hard of hearing Head- normocephalic, atraumatic Throat- moist mucus membrane Neck- no cervical lymphadenopathy Cardiovascular- normal s1,s2, no murmurs, palpable right dorsalis pedis Respiratory- bilateral clear to auscultation, no wheeze, no rhonchi, no crackles, no use of accessory muscles Abdomen- bowel sounds present, soft, non tender Musculoskeletal- able to move all 4 extremities, left leg in cast, able to move her toes in left leg Neurological- no focal deficit Skin- warm and dry, right shin bruises and both upper extremity bruises Psychiatry- alert, normal mood and affect    Labs reviewed: 12/31/14  sodium 145 potassium 3.6 glucose 91 BUN 14 creatinine 0.76 calcium 8.7 GFR 70 12/29/14  WBC 11.0 hemoglobin 10.6 hematocrit 34.0 MCV 101.5 Modified barium swallow shows severe aspiration 12/28/14  chest x-ray shows opacity of the left lower lobe suggesting an effusion and consolidation 12/27/14  Left ankle x-ray shows displaced fractures of the medial malleolus and distal fibula with mild inferior displacement of the medial malleolar fracture and mild lateral displacement of the distal fibular fracture. There is resultant lateral subluxation of the talus, mild underlying osteopenia of the calcaneus.   Assessment/Plan  Left ankle fracture S/p closed reduction. Has f/u with orthopedics. Will have him work with physical therapy and occupational therapy team to help with gait training and muscle strengthening exercises.fall precautions. Skin care. Encourage to be out of bed. Currently not weight bearing in left ankle. continue Lovenox for dvt prophylaxis.  Continue Norco 5/325 mg, ultram prn for pain. Continue oscal  HTN With elevated bp reading, will start pt on amlodipine 5 mg daily. Continue lopressor 100 mg tid, imdur 90 mg daily and prn clonidine. Monitor bp readings.  Pneumonia Clinically improved. On o2. Continue and complete course of azithromycin on 01/06/15.   Atrial fibrillation rate controlled. continue Lopressor 100 mg tid  Dysphagia Continue pureed feed with honey thick liquid, strict aspiration precautions  GERD continue Protonix 40 mg daily  Constipation Continue colace and prn dulcolax suppository   Goals of care: short term rehabilitation   Labs/tests ordered: cbc, cmp  Family/ staff Communication: reviewed care plan with patient and nursing supervisor    Oneal Grout, MD  [facility] [phone] (Monday-Friday 8 am - 5 pm) [phone] (afterhours)

## 2015-01-31 ENCOUNTER — Non-Acute Institutional Stay (SKILLED_NURSING_FACILITY): Payer: Medicare Other | Admitting: Adult Health

## 2015-01-31 ENCOUNTER — Encounter: Payer: Self-pay | Admitting: Adult Health

## 2015-01-31 DIAGNOSIS — R1310 Dysphagia, unspecified: Secondary | ICD-10-CM

## 2015-01-31 DIAGNOSIS — K219 Gastro-esophageal reflux disease without esophagitis: Secondary | ICD-10-CM

## 2015-01-31 DIAGNOSIS — E43 Unspecified severe protein-calorie malnutrition: Secondary | ICD-10-CM

## 2015-01-31 DIAGNOSIS — S82892A Other fracture of left lower leg, initial encounter for closed fracture: Secondary | ICD-10-CM | POA: Diagnosis not present

## 2015-01-31 DIAGNOSIS — I4891 Unspecified atrial fibrillation: Secondary | ICD-10-CM

## 2015-01-31 DIAGNOSIS — I5020 Unspecified systolic (congestive) heart failure: Secondary | ICD-10-CM | POA: Diagnosis not present

## 2015-01-31 DIAGNOSIS — K5900 Constipation, unspecified: Secondary | ICD-10-CM | POA: Diagnosis not present

## 2015-01-31 DIAGNOSIS — I10 Essential (primary) hypertension: Secondary | ICD-10-CM

## 2015-01-31 DIAGNOSIS — H409 Unspecified glaucoma: Secondary | ICD-10-CM | POA: Diagnosis not present

## 2015-01-31 NOTE — Progress Notes (Signed)
Patient ID: Penny Alvarez, female   DOB: Jun 18, 1918, 81 y.o.   MRN: [id number]   01/31/2015  Facility:  Nursing Home Location:  [facility] Room Number: 1003-1 LEVEL OF CARE:  SNF (31)   Chief Complaint  Patient presents with  . Medical Management of Chronic Issues    Left ankle fracture S/P closed reductio, manipulation and casting, atrial fibrillation, CHF, dysphagia, hypertension, GERD, glaucoma, constipation and protein calorie malnutrition    HISTORY OF PRESENT ILLNESS:  This is a 81 year old female who is being seen for a routine visit. [facility]. [facility] and apparently struck her left ankle on the side of the bed. She sustained a left ankle fracture. On 12/27/14 a closed reduction, manipulation and casting of left ankle was done. Chest x-ray on 12/28/14 shows opacity of the left lower lobe suggesting effusion and consolidation. She was started on IV Rocephin and Zithromax. She has been admitted for a short-term rehabilitation.  Patient has Dysphagia and was recently discharged from speech therapy - she has reached maximum potential and plateaued. She has poor po intake and has albumin of 2.8. Supplementations given. Palliative consult was done and she is now on hospice care.  PAST MEDICAL HISTORY:  Past Medical History  Diagnosis Date  . Hypertension   . Glaucoma   . Osteoporosis   . GERD (gastroesophageal reflux disease)   . Hyperlipidemia   . COPD (chronic obstructive pulmonary disease)   . Arthritis     OA  . Congestive heart failure     Chronic AFib    CURRENT MEDICATIONS: Reviewed per MAR/see medication list  Allergies  Allergen Reactions  . Actonel [Risedronate Sodium]   . Aspirin   . Depakote [Divalproex Sodium]   . Diltiazem   . Fosamax [Alendronate Sodium]   . Hydralazine      REVIEW OF SYSTEMS:  Unable to obtain, patient is not a  good historian  PHYSICAL EXAMINATION  GENERAL: no acute distress, normal body habitus NECK: supple, trachea midline, no neck masses, no thyroid tenderness, no thyromegaly LYMPHATICS: no LAN in the neck, no supraclavicular LAN RESPIRATORY: breathing is even & unlabored, BS CTAB CARDIAC: Irregularly irregular heart rhythm, no murmur,no extra heart sounds,RLE trace edema GI: abdomen soft, normal BS, no masses, no tenderness, no hepatomegaly, no splenomegaly EXTREMITIES: LLE with cast, able to wiggle toes PSYCHIATRIC: the patient is alert & oriented to person, affect & behavior appropriate  LABS/RADIOLOGY:  01/24/15  sodium 145 potassium 3.7 glucose 147 BUN 14 creatinine 0.77 calcium 9.4 01/22/15  sodium 147 potassium 3.9 glucose 114 BUN 17 creatinine 0.85 calcium 9.7 01/05/15  WBC 7.0 hemoglobin 12.2 hematocrit 37.6 MCV 94.2 sodium 141 potassium 3.9 glucose 105 BUN 12 creatinine 0.76 SGOT 26 SGPT 19 and total protein 5.6 albumin 2.8 calcium 8.9 12/31/14  sodium 145 potassium 3.6 glucose 91 BUN 14 creatinine 0.76 calcium 8.7 GFR 70 12/29/14  WBC 11.0 hemoglobin 10.6 hematocrit 34.0 MCV 101.5 Modified barium swallow shows severe aspiration 12/28/14  chest x-ray shows opacity of the left lower lobe suggesting an effusion and consolidation 12/27/14  Left ankle x-ray shows displaced fractures of the medial malleolus and distal fibula with mild inferior displacement of the medial malleolar fracture and mild lateral displacement of the distal fibular fracture. There is resultant lateral subluxation of the talus, mild underlying osteopenia of the calcaneus.   ASSESSMENT/PLAN:  Left ankle  fracture S/P closed reduction, manipulation and casting - nwb on left ankle; Norco 5/325 mg 1 tab by mouth every 6 hours when necessary Ultram 50 mg 1 tab by mouth every 8 hours when necessary and Tylenol 1000 mg by mouth every a.m. and 650 mg by mouth daily at bedtime for pain; follow-up with orthopedic, Dr. Precious Gilding in 4  weeks Atrial fibrillation with RVR - rate controlled; continue Lopressor 100 mg by mouth 3 times a day CHF - stable; Bumex was tolerated in the hospital and may use if needed; she is allergic to Lasix and hydrochlorothiazide Dysphagia - no PEG tube per patient's wishes Hypertension - continue Lopressor 100 mg by mouth 3 times a day, Amlodipine 5 mg PO Q D, Imdur 30 mg ER 3 tabs =90 mg PO Q D and clonidine 0.1 mg by mouth every 6 hours when necessary for SBP >160 GERD - continue Protonix 40 mg by mouth daily Glaucoma - continue Cosopt and latanoprost eyedrops Protein calorie malnutrition, severe - albumin 2.8; continue Eldertonic 15 ml PO BID and supplementation   Goals of care:  Comfort care, on hospice care   Labs/test ordered:  none    [facility], NP [employer] [phone]

## 2015-02-12 ENCOUNTER — Other Ambulatory Visit: Payer: Self-pay

## 2015-02-12 MED ORDER — MORPHINE SULFATE (CONCENTRATE) 20 MG/ML PO SOLN: ORAL | Status: AC

## 2015-02-12 MED ORDER — LORAZEPAM 0.5 MG PO TABS: ORAL_TABLET | ORAL | Status: AC

## 2015-02-16 DEATH — deceased

## 2015-08-07 IMAGING — CT CT HEAD W/O CM
3 series · 17 of 40 positions shown, 19 images · non-contrast
Comparison: 07/10/2011.

CLINICAL DATA: Neck pain and nasal abrasion following a fall today.

EXAM:
CT HEAD WITHOUT CONTRAST
CT CERVICAL SPINE WITHOUT CONTRAST
TECHNIQUE: Multidetector CT imaging of the head and cervical spine was
performed following the standard protocol without intravenous
contrast. Multiplanar CT image reconstructions of the cervical spine
were also generated.

[Series 2: head 4.8 h37s · axial · 0.45mm/px · z∈[-171,-49]mm · 7 of 35 slices shown, 9 images]
[im 5/35  brain]
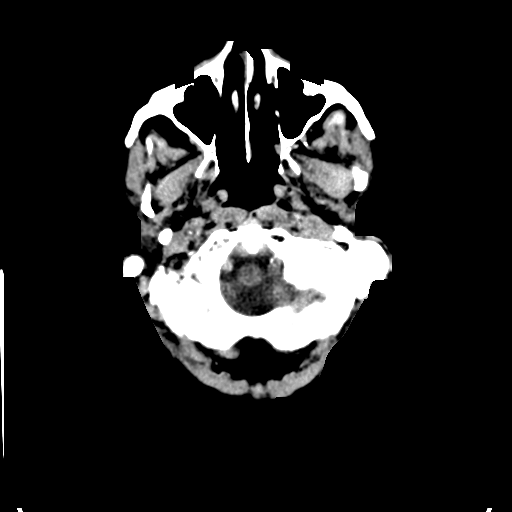
[im 5/35  bone]
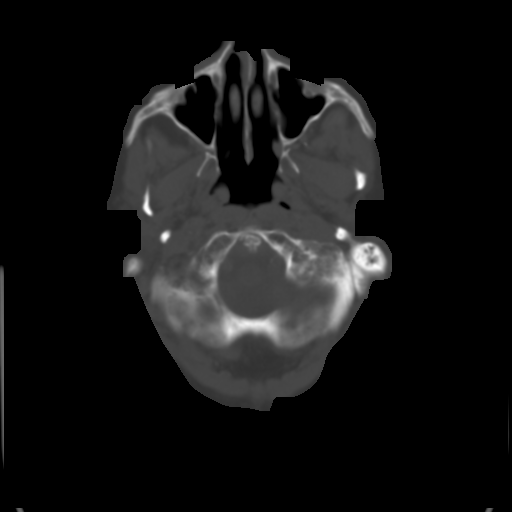
[im 9/35  brain]
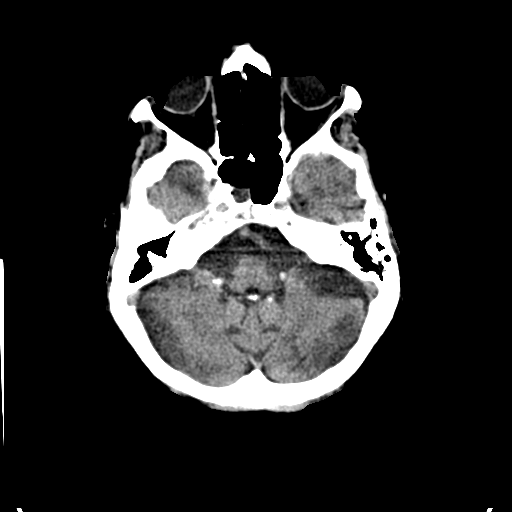
[im 13/35  brain]
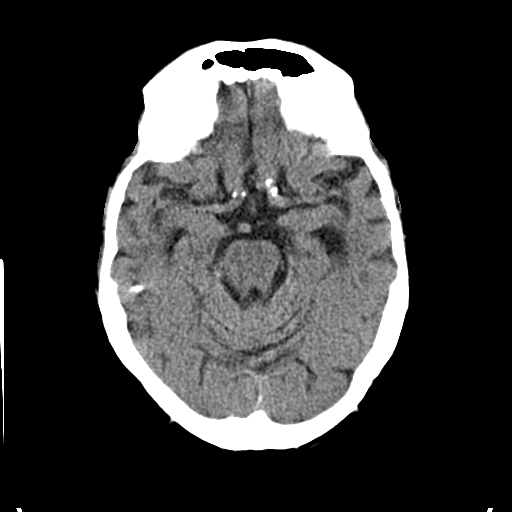
[im 18/35  brain]
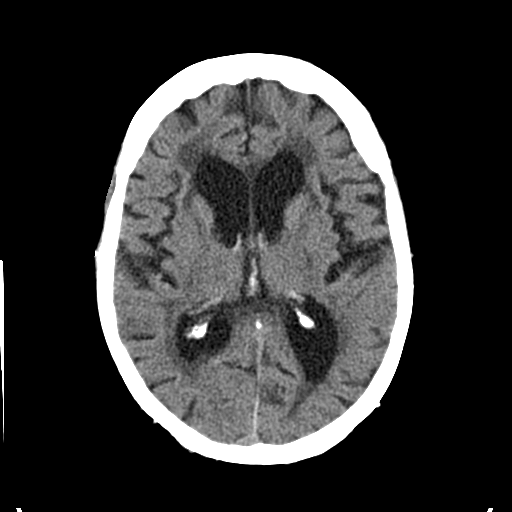
[im 22/35  brain]
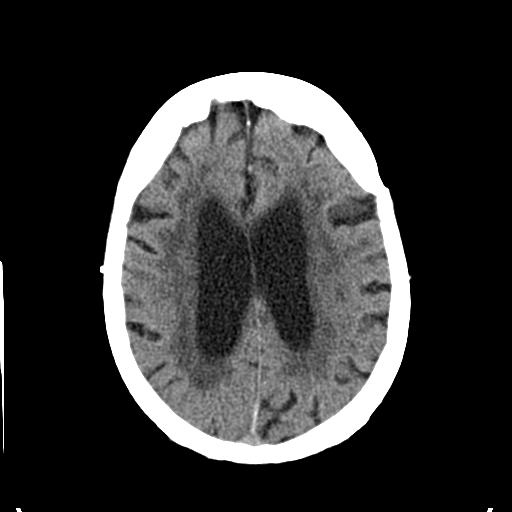
[im 22/35  bone]
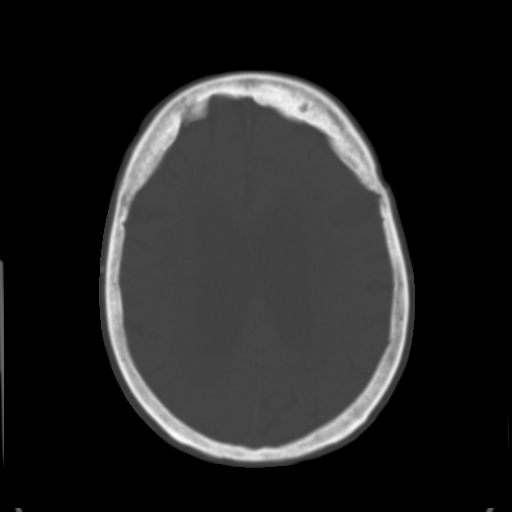
[im 26/35  brain]
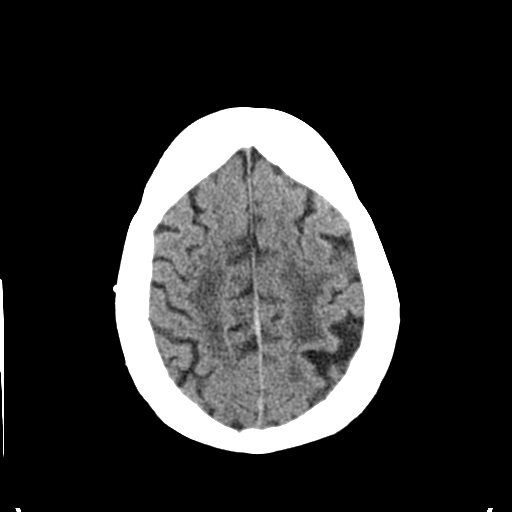
[im 30/35  brain]
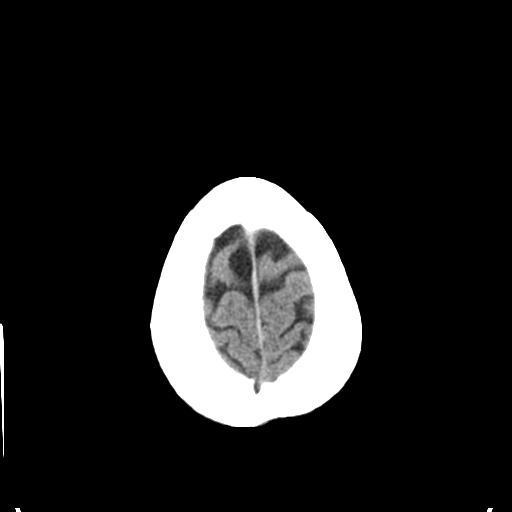

[Series 5: c_spine 2.0 b41s st · axial · 0.26mm/px · z∈[-324,-204]mm · 7 of 86 slices shown]
[im 9/86  brain]
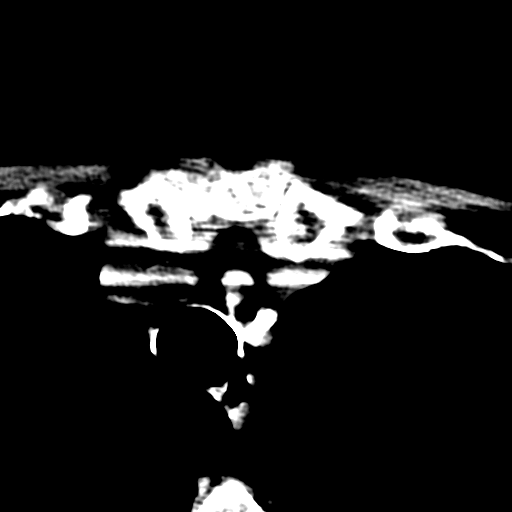
[im 18/86  brain]
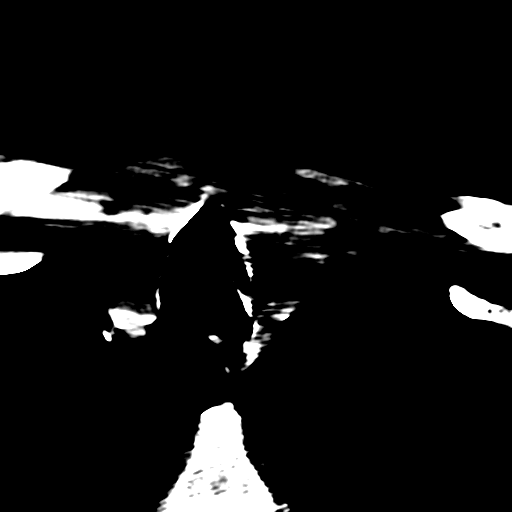
[im 26/86  brain]
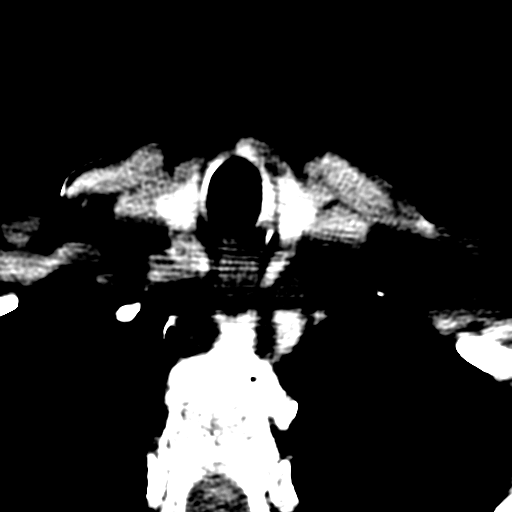
[im 39/86  brain]
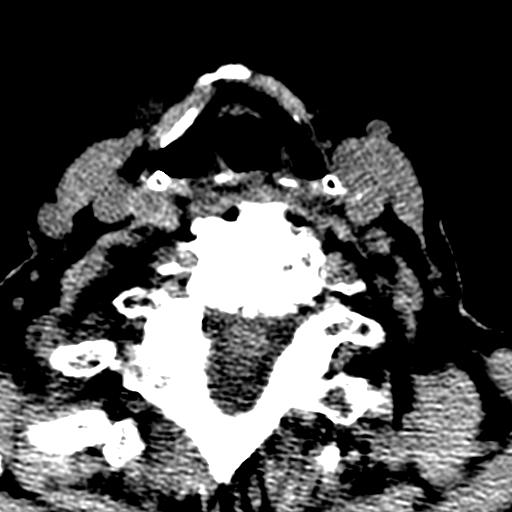
[im 47/86  brain]
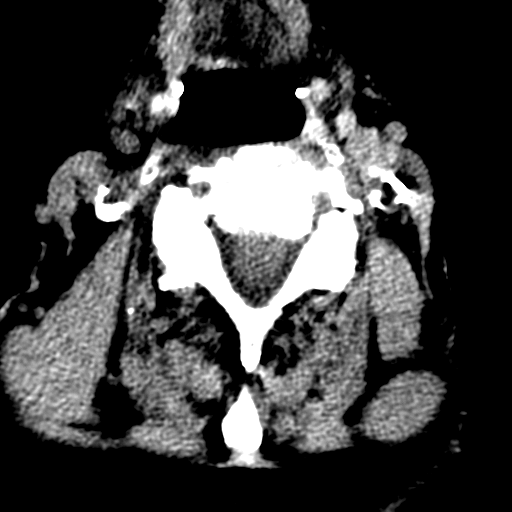
[im 60/86  brain]
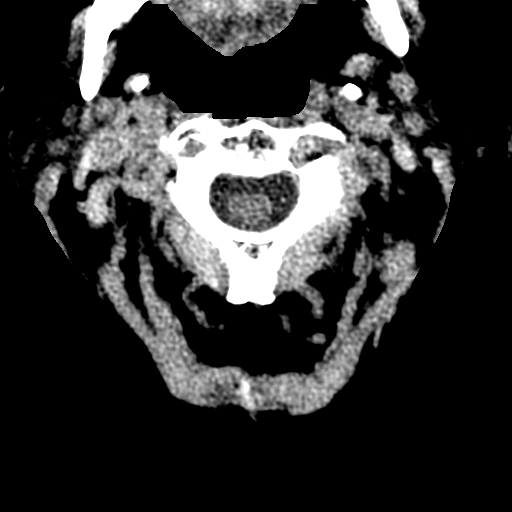
[im 69/86  brain]
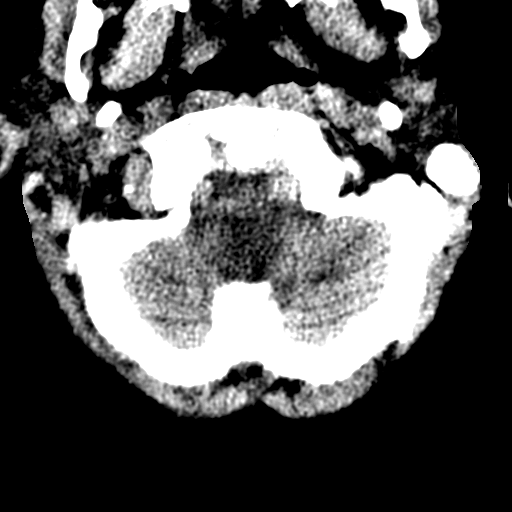

[Series 8: c_spine 2.0 coronal · coronal · 0.33mm/px · 3 of 39 slices shown]
[im 13/39  brain]
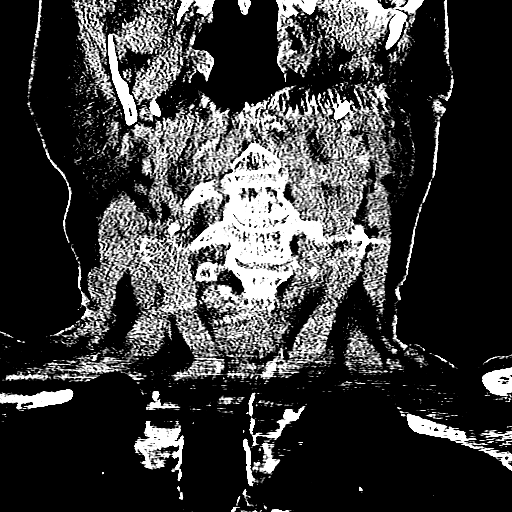
[im 17/39  brain]
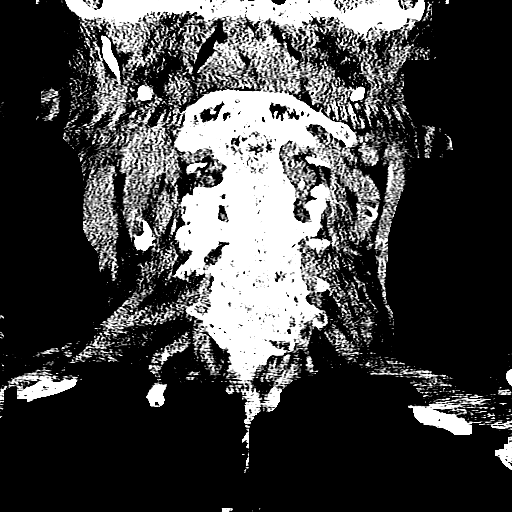
[im 22/39  brain]
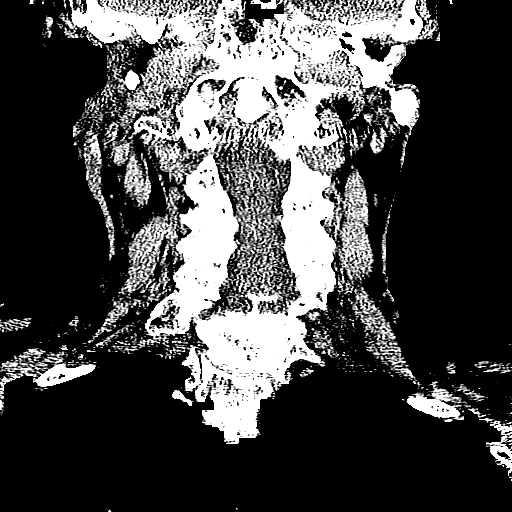

[17 of 40 positions shown; findings below may reference images not displayed]

FINDINGS: CT HEAD FINDINGS

Diffusely enlarged ventricles and subarachnoid spaces. Patchy white
matter low density in both cerebral hemispheres. Old bilateral basal
ganglia and right thalamic lacunar infarcts. No skull fracture,
intracranial hemorrhage, mass lesion or CT evidence of acute
infarction. Interval right sphenoid sinus air-fluid level. Moderate
left maxillary sinus mucosal thickening inferiorly, not previously
included.

CT CERVICAL SPINE FINDINGS

Multilevel degenerative changes. Stable fusion of the C5 and C6
vertebral bodies. Facet degenerative changes with associated minimal
anterolisthesis at the C4-5 level without significant change. Mild
anterolisthesis at the C7-T1 and T1-2 levels is also stable. No
fractures or acute subluxations. No prevertebral soft tissue
swelling. Bilateral carotid artery calcifications. The lung apices
are clear.
IMPRESSION: 1. No skull fracture or intracranial hemorrhage.
2. No cervical spine fracture or acute subluxation.
3. Stable diffuse cerebral and cerebellar atrophy, chronic small
vessel white matter ischemic changes and old tender infarcts.
4. Stable cervical spine degenerative changes.
5. Bilateral carotid artery atheromatous calcifications.
6. Acute right sphenoid sinusitis and chronic left maxillary
sinusitis.

## 2015-08-07 IMAGING — CR DG SHOULDER 2+V*R*
2 series · 2 of 2 positions shown · non-contrast
Comparison: 10/07/2014.  Chest radiographs dated 10/14/2014.

CLINICAL DATA: Right shoulder pain.  Fell today and fell yesterday.

EXAM:
RIGHT SHOULDER - 2+ VIEW

[t shoulder ap external righ]
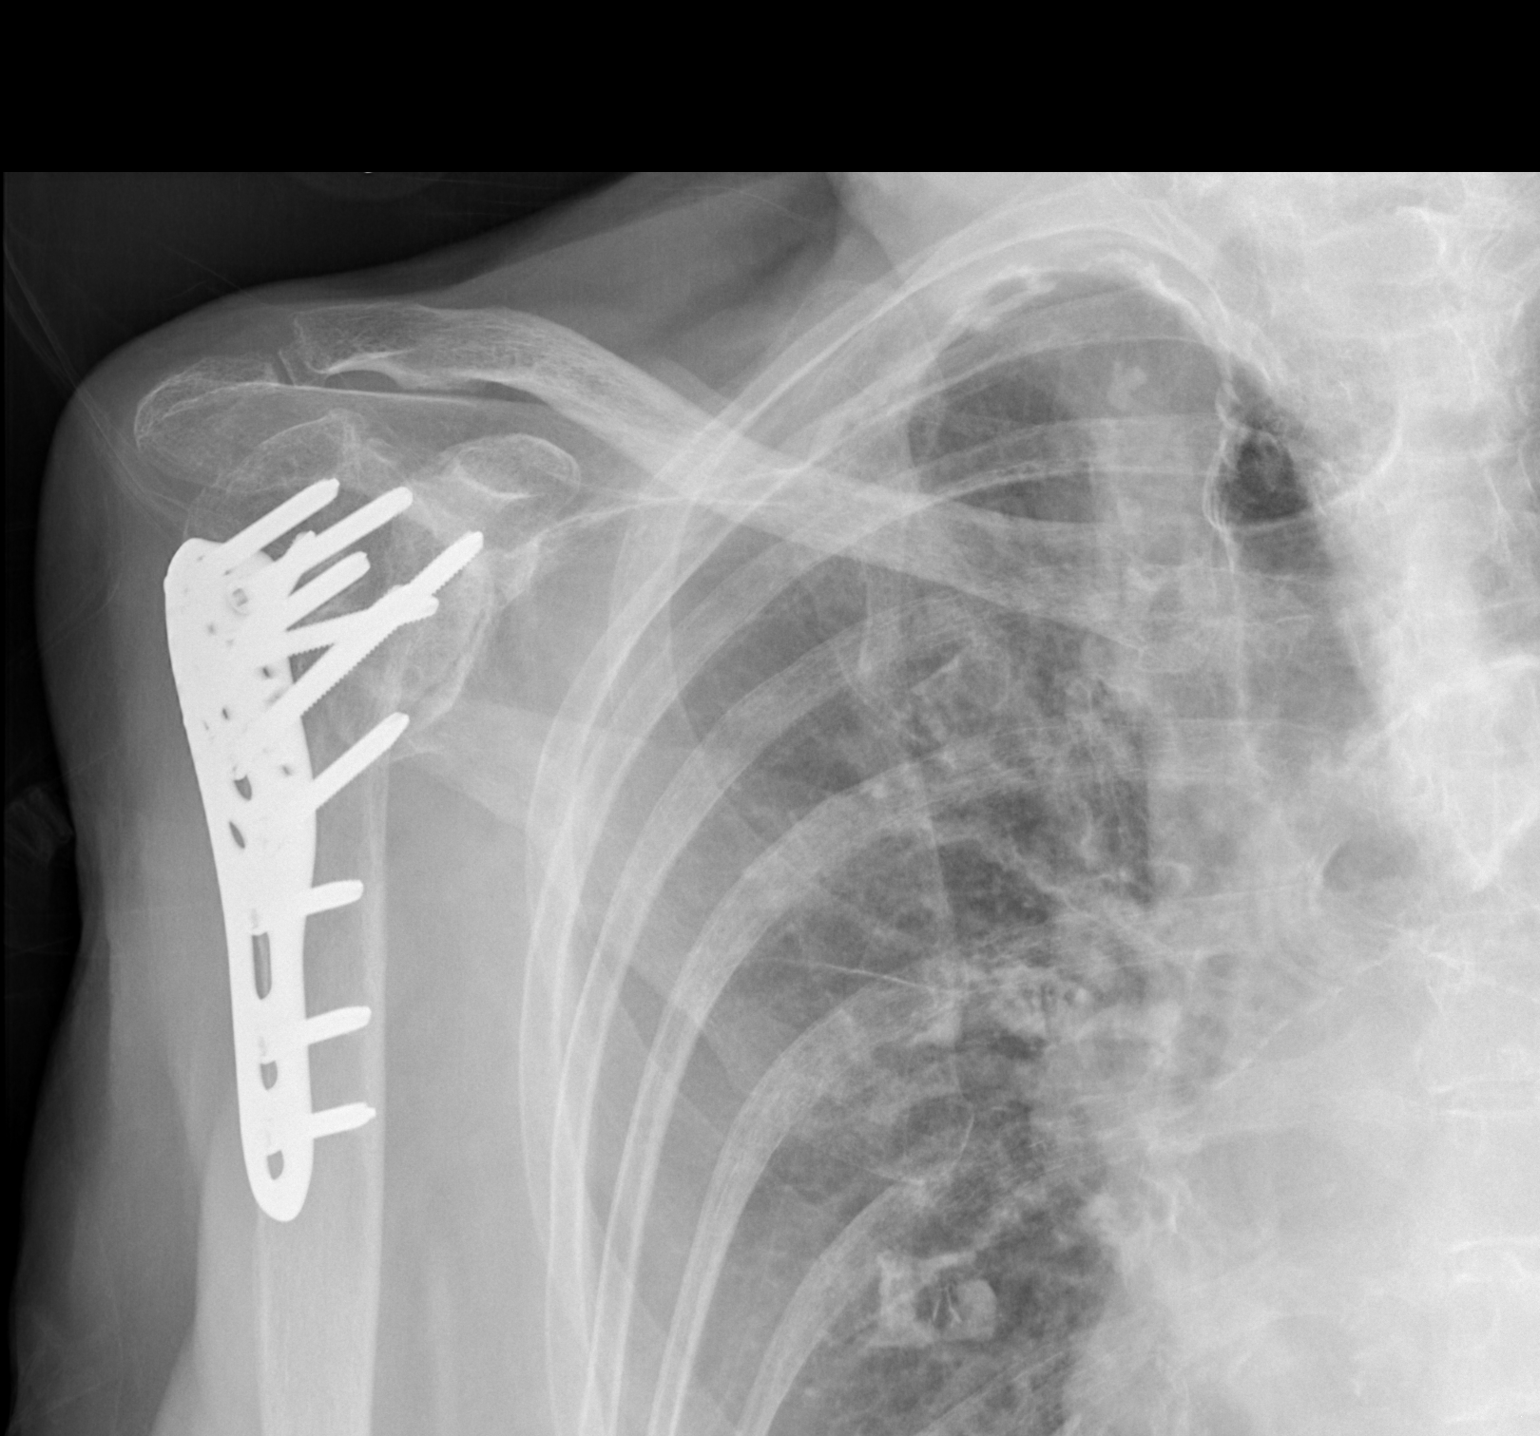

[t shoulder y view right]
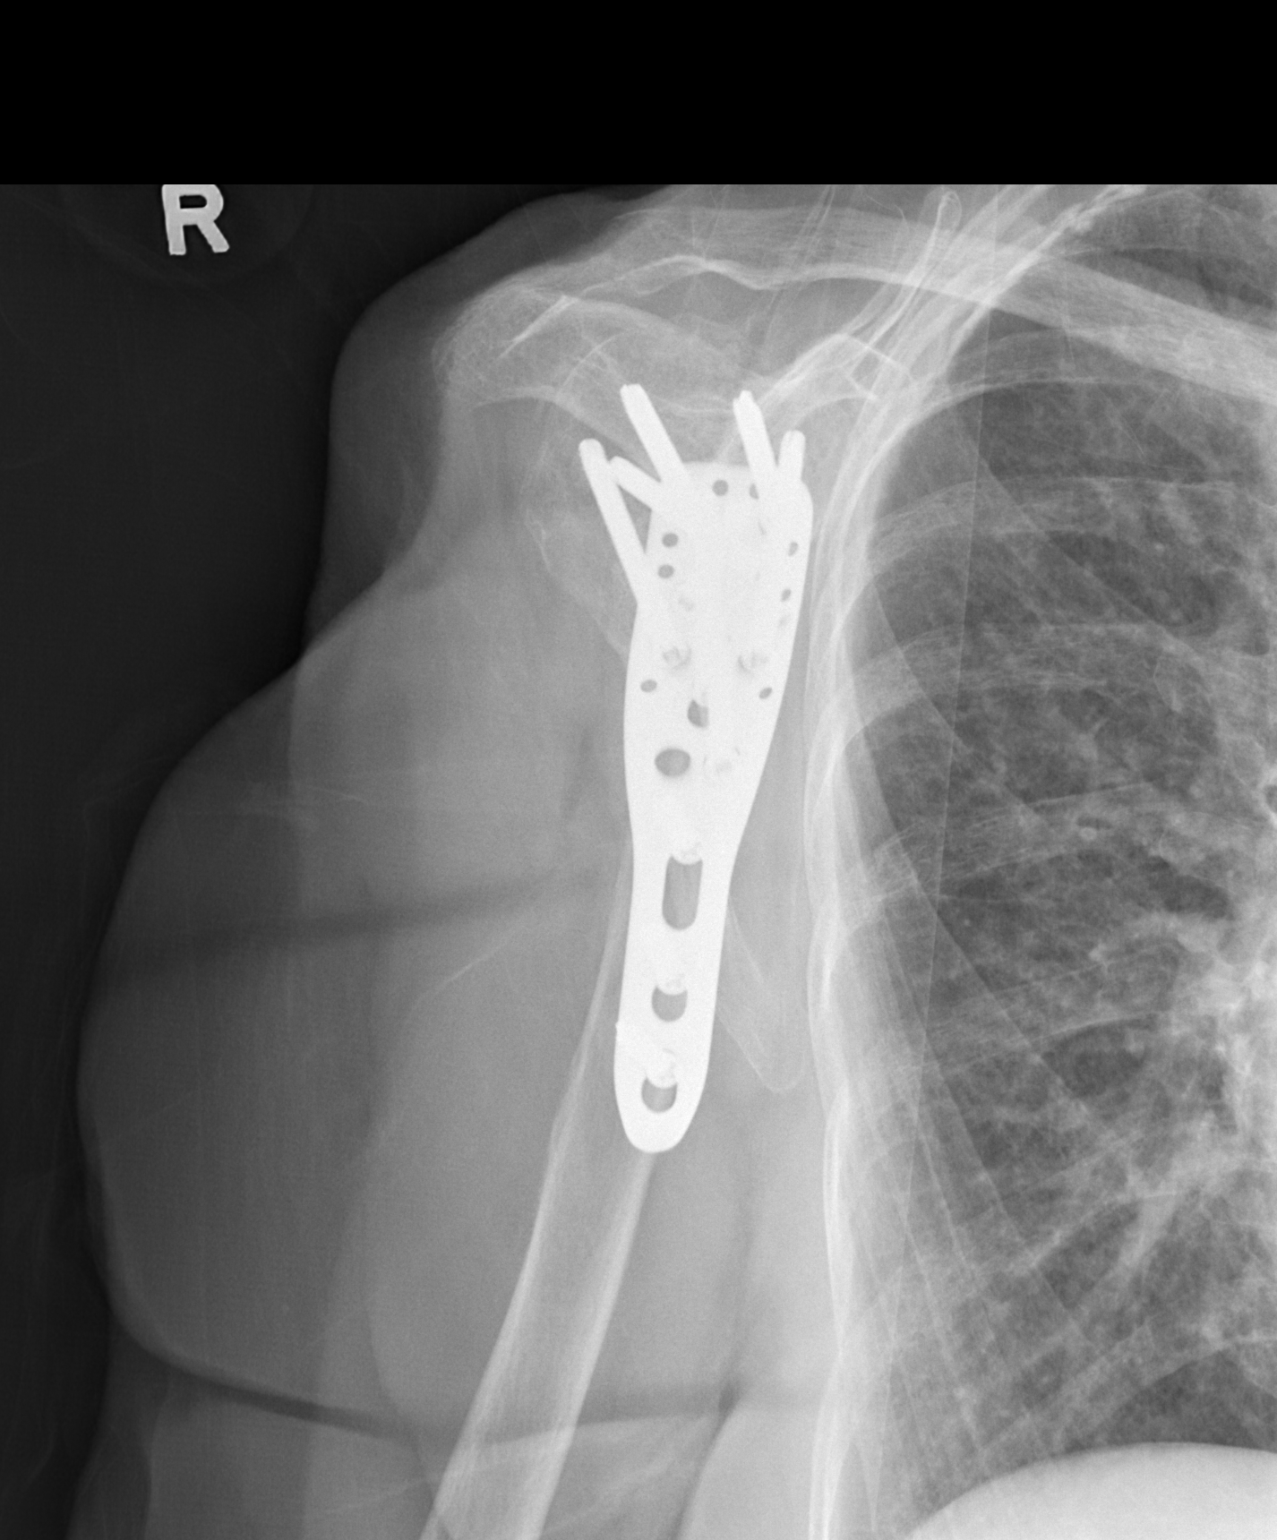

[2 of 2 positions shown; findings below may reference images not displayed]

FINDINGS: Previously demonstrated screw and plate fixation of the proximal
humerus. Previously noted right glenohumeral joint degenerative
changes and diffuse osteopenia. No acute fracture or dislocation
seen. There is a rectangular-shaped calcific density overlying the
right mid to lower lung zone on the frontal view, not seen on the
axillary view or on chest radiographs obtained today.
IMPRESSION: No acute fracture dislocation. Hardware fixation of an old proximal
right humerus fracture and right glenohumeral joint degenerative
changes.
